# Patient Record
Sex: Female | Born: 1980 | Race: White | Hispanic: No | Marital: Married | Smoking: Never smoker
Health system: Southern US, Community
[De-identification: ages and names within clinical notes are randomized; demographics above are authoritative.]

## PROBLEM LIST (undated history)

## (undated) DIAGNOSIS — F909 Attention-deficit hyperactivity disorder, unspecified type: Secondary | ICD-10-CM

---

## 2016-04-28 ENCOUNTER — Ambulatory Visit
Admission: EM | Admit: 2016-04-28 | Discharge: 2016-04-28 | Disposition: A | Discharge status: Home / Self Care | Payer: BLUE CROSS/BLUE SHIELD | Attending: Family Medicine | Admitting: Family Medicine

## 2016-04-28 ENCOUNTER — Encounter: Payer: Self-pay | Admitting: Gynecology

## 2016-04-28 DIAGNOSIS — M545 Low back pain, unspecified: Secondary | ICD-10-CM

## 2016-04-28 DIAGNOSIS — S39012A Strain of muscle, fascia and tendon of lower back, initial encounter: Secondary | ICD-10-CM

## 2016-04-28 HISTORY — DX: Attention-deficit hyperactivity disorder, unspecified type: F90.9

## 2016-04-28 MED ORDER — HYDROCODONE-ACETAMINOPHEN 5-325 MG PO TABS: ORAL_TABLET | ORAL | Status: DC

## 2016-04-28 MED ORDER — CYCLOBENZAPRINE HCL 10 MG PO TABS: 10.0000 mg | ORAL_TABLET | Freq: Three times a day (TID) | ORAL | Status: DC | PRN

## 2016-04-28 NOTE — Discharge Instructions (Signed)

## 2016-04-28 NOTE — ED Notes (Signed)
Patient stated while bending over x today felt a crack in her back. Per patient felt afterwards.

## 2016-04-28 NOTE — ED Provider Notes (Signed)
CSN: [id number]     Arrival date & time 04/28/16  1223 History   First MD Initiated Contact with Patient 04/28/16 1248     Chief Complaint  Patient presents with  . Back Pain   (Consider location/radiation/quality/duration/timing/severity/associated sxs/prior Treatment) HPI Comments: 35 yo female with a c/o low back pain since this injuring it this morning. States she was bending forward to pick something up when she suddenly "felt a crack" and pain on her lower back. She denies falling or any direct trauma. Denies any fevers, chills, dysuria, numbness/tingling, saddle anesthesia, radiating pain, bowel or bladder problems.  The history is provided by the patient.    Past Medical History  Diagnosis Date  . ADHD (attention deficit hyperactivity disorder)    History reviewed. No pertinent past surgical history. No family history on file. Social History  Substance Use Topics  . Smoking status: Never Smoker   . Smokeless tobacco: None  . Alcohol Use: Yes   OB History    No data available     Review of Systems  Allergies  Codeine  Home Medications   Prior to Admission medications   Medication Sig Start Date End Date Taking? Authorizing Provider  amphetamine-dextroamphetamine (ADDERALL) 20 MG tablet Take 20 mg by mouth daily.   Yes Historical Provider, MD  cyclobenzaprine (FLEXERIL) 10 MG tablet Take 1 tablet (10 mg total) by mouth 3 (three) times daily as needed for muscle spasms. 04/28/16   Courtney Mccallumrlando Laylah Riga, MD  HYDROcodone-acetaminophen (NORCO/VICODIN) 5-325 MG tablet 1-2 tabs po q 8 hours prn 04/28/16   Courtney Mccallumrlando Polo Mcmartin, MD   Meds Ordered and Administered this Visit  Medications - No data to display  BP 121/73 mmHg  Pulse 69  Temp(Src) 98.1 F (36.7 C) (Oral)  Resp 16  Ht 5\' 6"  (1.676 m)  LMP 04/07/2016 No data found.   Physical Exam  Constitutional: She appears well-developed and well-nourished. No distress.  Musculoskeletal: She exhibits tenderness. She exhibits no  edema.       Lumbar back: She exhibits tenderness (over the lumbar sacral paraspinous muscles bilaterally; no bony tenderness) and spasm. She exhibits normal range of motion, no bony tenderness, no swelling, no edema, no deformity, no laceration, no pain and normal pulse.  Neurological: She is alert. She has normal reflexes. She displays normal reflexes. She exhibits normal muscle tone. Coordination normal.  Skin: Skin is warm and dry. No rash noted. She is not diaphoretic. No erythema.  Nursing note and vitals reviewed.   ED Course  Procedures (including critical care time)  Labs Review Labs Reviewed - No data to display  Imaging Review No results found.   Visual Acuity Review  Right Eye Distance:   Left Eye Distance:   Bilateral Distance:    Right Eye Near:   Left Eye Near:    Bilateral Near:         MDM   1. Midline low back pain without sciatica   2. Lumbar strain, initial encounter    Discharge Medication List as of 04/28/2016 12:59 PM    START taking these medications   Details  cyclobenzaprine (FLEXERIL) 10 MG tablet Take 1 tablet (10 mg total) by mouth 3 (three) times daily as needed for muscle spasms., Starting 04/28/2016, Until Discontinued, Normal    HYDROcodone-acetaminophen (NORCO/VICODIN) 5-325 MG tablet 1-2 tabs po q 8 hours prn, Print       1. diagnosis reviewed with patient 2. rx as per orders above; reviewed possible side effects, interactions, risks and  benefits  3. Recommend supportive treatment with heat, rest, gentle stretches  4. Follow-up prn if symptoms worsen or don't improve    Courtney Mccallum, MD 05/01/16 1530

## 2016-11-20 ENCOUNTER — Ambulatory Visit
Admission: EM | Admit: 2016-11-20 | Discharge: 2016-11-20 | Disposition: A | Discharge status: Home / Self Care | Payer: BLUE CROSS/BLUE SHIELD | Attending: Family Medicine | Admitting: Family Medicine

## 2016-11-20 ENCOUNTER — Ambulatory Visit
Admit: 2016-11-20 | Discharge: 2016-11-20 | Disposition: A | Discharge status: Home / Self Care | Payer: BLUE CROSS/BLUE SHIELD | Source: Ambulatory Visit | Attending: Emergency Medicine | Admitting: Emergency Medicine

## 2016-11-20 DIAGNOSIS — K828 Other specified diseases of gallbladder: Secondary | ICD-10-CM | POA: Insufficient documentation

## 2016-11-20 DIAGNOSIS — R1011 Right upper quadrant pain: Secondary | ICD-10-CM | POA: Insufficient documentation

## 2016-11-20 DIAGNOSIS — R112 Nausea with vomiting, unspecified: Secondary | ICD-10-CM | POA: Insufficient documentation

## 2016-11-20 DIAGNOSIS — R1013 Epigastric pain: Secondary | ICD-10-CM | POA: Diagnosis not present

## 2016-11-20 LAB — CBC WITH DIFFERENTIAL/PLATELET
Basophils Absolute: 0 10*3/uL (ref 0–0.1)
Basophils Relative: 1 %
Eosinophils Absolute: 0.1 10*3/uL (ref 0–0.7)
Eosinophils Relative: 2 %
HCT: 39.2 % (ref 35.0–47.0)
Hemoglobin: 13.1 g/dL (ref 12.0–16.0)
Lymphocytes Relative: 25 %
Lymphs Abs: 1.3 10*3/uL (ref 1.0–3.6)
MCH: 30.2 pg (ref 26.0–34.0)
MCHC: 33.3 g/dL (ref 32.0–36.0)
MCV: 90.5 fL (ref 80.0–100.0)
Monocytes Absolute: 0.4 10*3/uL (ref 0.2–0.9)
Monocytes Relative: 9 %
Neutro Abs: 3.3 10*3/uL (ref 1.4–6.5)
Neutrophils Relative %: 63 %
Platelets: 231 10*3/uL (ref 150–440)
RBC: 4.33 MIL/uL (ref 3.80–5.20)
RDW: 13.9 % (ref 11.5–14.5)
WBC: 5.1 10*3/uL (ref 3.6–11.0)

## 2016-11-20 LAB — COMPREHENSIVE METABOLIC PANEL
ALT: 13 U/L — ABNORMAL LOW (ref 14–54)
AST: 16 U/L (ref 15–41)
Albumin: 4.1 g/dL (ref 3.5–5.0)
Alkaline Phosphatase: 58 U/L (ref 38–126)
Anion gap: 5 (ref 5–15)
BUN: 13 mg/dL (ref 6–20)
CO2: 27 mmol/L (ref 22–32)
Calcium: 8.7 mg/dL — ABNORMAL LOW (ref 8.9–10.3)
Chloride: 103 mmol/L (ref 101–111)
Creatinine, Ser: 0.71 mg/dL (ref 0.44–1.00)
GFR calc Af Amer: >60 mL/min (ref >60)
GFR calc non Af Amer: >60 mL/min (ref >60)
Glucose, Bld: 96 mg/dL (ref 65–99)
Potassium: 4.1 mmol/L (ref 3.5–5.1)
Sodium: 135 mmol/L (ref 135–145)
Total Bilirubin: 0.7 mg/dL (ref 0.3–1.2)
Total Protein: 6.7 g/dL (ref 6.5–8.1)

## 2016-11-20 LAB — LIPASE, BLOOD: Lipase: 24 U/L (ref 11–51)

## 2016-11-20 MED ORDER — KETOROLAC TROMETHAMINE 60 MG/2ML IM SOLN
60.0000 mg | Freq: Once | INTRAMUSCULAR | Status: AC
  Administered 2016-11-20: 60 mg via INTRAMUSCULAR

## 2016-11-20 MED ORDER — ONDANSETRON 8 MG PO TBDP: 8.0000 mg | ORAL_TABLET | Freq: Two times a day (BID) | ORAL | 0 refills | Status: DC

## 2016-11-20 MED ORDER — NAPROXEN 500 MG PO TABS: 500.0000 mg | ORAL_TABLET | Freq: Two times a day (BID) | ORAL | 0 refills | Status: DC

## 2016-11-20 MED ORDER — PANTOPRAZOLE SODIUM 40 MG PO TBEC: 40.0000 mg | DELAYED_RELEASE_TABLET | Freq: Every day | ORAL | 0 refills | Status: DC

## 2016-11-20 NOTE — Progress Notes (Signed)
Results were discussed in detail with the patient over the phone. Advised her to follow-up with her primary care physician next week for scheduling of a scan. She acknowledged understanding. She will begin taking the medications that I have prescribed for her until she is evaluated by her primary care physician.

## 2016-11-20 NOTE — ED Triage Notes (Addendum)
Pt c/o acid reflux for the last 3 days. There is some relief but it seems to happen more at night. She did have a beer yesterday.

## 2016-11-20 NOTE — ED Notes (Signed)
Ultrasound Appt made for 4:30 today, pt was notified to stay NPO until 4:30pm

## 2016-11-20 NOTE — ED Provider Notes (Signed)
CSN: [id number]     Arrival date & time 11/20/16  1048 History   First MD Initiated Contact with Patient 11/20/16 1202     Chief Complaint  Patient presents with  . Gastroesophageal Reflux   (Consider location/radiation/quality/duration/timing/severity/associated sxs/prior Treatment) HPI  This a 36 year old female who presents today history of epigastric and right upper quadrant pain with nausea and vomiting. She states that the pain has been worse last night and today. In the past her primary care physician has wanted her to have a HIDA scan but for various reasons the patient was not able to undergo the HIDA scan. At the present time she indicates pain in the costophrenic angle  anterior on the right with  radiation into her back.. At the present time she is not nauseated. Pain is 6 out of 10.        Past Medical History:  Diagnosis Date  . ADHD (attention deficit hyperactivity disorder)    History reviewed. No pertinent surgical history. Family History  Problem Relation Age of Onset  . Cholecystitis Mother   . Hypertension Father    Social History  Substance Use Topics  . Smoking status: Never Smoker  . Smokeless tobacco: Never Used  . Alcohol use Yes   OB History    No data available     Review of Systems  Constitutional: Positive for activity change and appetite change. Negative for chills, fatigue and fever.  Gastrointestinal: Positive for abdominal pain, nausea and vomiting. Negative for constipation, diarrhea and rectal pain.  All other systems reviewed and are negative.   Allergies  Codeine  Home Medications   Prior to Admission medications   Medication Sig Start Date End Date Taking? Authorizing Provider  amphetamine-dextroamphetamine (ADDERALL) 20 MG tablet Take 20 mg by mouth daily.   Yes Historical Provider, MD  ranitidine (ZANTAC) 150 MG tablet Take 150 mg by mouth 2 (two) times daily.   Yes Historical Provider, MD  naproxen (NAPROSYN) 500 MG tablet  Take 1 tablet (500 mg total) by mouth 2 (two) times daily with a meal. 11/20/16   Lutricia FeilWilliam P Roemer, PA-C  ondansetron (ZOFRAN ODT) 8 MG disintegrating tablet Take 1 tablet (8 mg total) by mouth 2 (two) times daily. 11/20/16   Lutricia FeilWilliam P Roemer, PA-C  pantoprazole (PROTONIX) 40 MG tablet Take 1 tablet (40 mg total) by mouth daily. 11/20/16   Lutricia FeilWilliam P Roemer, PA-C   Meds Ordered and Administered this Visit   Medications  ketorolac (TORADOL) injection 60 mg (60 mg Intramuscular Given 11/20/16 1234)    BP 120/84 (BP Location: Left Arm)   Pulse 72   Temp 98 F (36.7 C) (Oral)   Resp 18   Ht 5\' 6"  (1.676 m)   Wt 162 lb (73.5 kg)   LMP 11/12/2016   SpO2 99%   BMI 26.15 kg/m  No data found.   Physical Exam  Constitutional: She is oriented to person, place, and time. She appears well-developed and well-nourished. No distress.  HENT:  Head: Normocephalic and atraumatic.  Eyes: EOM are normal. Pupils are equal, round, and reactive to light.  Neck: Normal range of motion. Neck supple.  Pulmonary/Chest: Effort normal and breath sounds normal. No respiratory distress. She has no wheezes. She has no rales.  Abdominal: Soft. Bowel sounds are normal. She exhibits no distension and no mass. There is tenderness. There is no rebound and no guarding.  Abdominal examination was performed with Jacki ConesLaurie, RN as Nurse, children'schaperone and assistant. Patient shows no distention of  the abdominal wall. Sounds are normal in all quadrants. She has mild tenderness in the epigastric region but more significant tenderness over the costophrenic angle at the right upper quadrant. There is a positive Murphy's sign per present. There is no guarding and no rebound present. Some diffuse tenderness in the upper quadrants.  Musculoskeletal: Normal range of motion.  Lymphadenopathy:    She has no cervical adenopathy.  Neurological: She is alert and oriented to person, place, and time.  Skin: Skin is warm and dry. She is not diaphoretic.   Psychiatric: She has a normal mood and affect. Her behavior is normal. Judgment and thought content normal.  Nursing note and vitals reviewed.   Urgent Care Course   Clinical Course     Procedures (including critical care time)  Labs Review Labs Reviewed  COMPREHENSIVE METABOLIC PANEL - Abnormal; Notable for the following:       Result Value   Calcium 8.7 (*)    ALT 13 (*)    All other components within normal limits  CBC WITH DIFFERENTIAL/PLATELET  LIPASE, BLOOD    Imaging Review No results found.   Visual Acuity Review  Right Eye Distance:   Left Eye Distance:   Bilateral Distance:    Right Eye Near:   Left Eye Near:    Bilateral Near:     Medications  ketorolac (TORADOL) injection 60 mg (60 mg Intramuscular Given 11/20/16 1234)      MDM   1. Epigastric abdominal pain   2. Non-intractable vomiting with nausea, unspecified vomiting type    Discharge Medication List as of 11/20/2016  1:15 PM    START taking these medications   Details  naproxen (NAPROSYN) 500 MG tablet Take 1 tablet (500 mg total) by mouth 2 (two) times daily with a meal., Starting Tue 11/20/2016, Normal    ondansetron (ZOFRAN ODT) 8 MG disintegrating tablet Take 1 tablet (8 mg total) by mouth 2 (two) times daily., Starting Tue 11/20/2016, Normal    pantoprazole (PROTONIX) 40 MG tablet Take 1 tablet (40 mg total) by mouth daily., Starting Tue 11/20/2016, Normal      Plan: 1. Test/x-ray results and diagnosis reviewed with patient 2. rx as per orders; risks, benefits, potential side effects reviewed with patient 3. Recommend supportive treatment with Fluids and rest. Patient was set up for a abdominal ultrasound of the right upper quadrant to rule out cholecystitis.to be performed at [facility] at 4:30 this afternoon. She did receive only slight improvement in her pain after the Toradol injection. She was not nauseated and did not vomit while in the facility. I told her that she will need to follow-up  with her primary care provider evaluation and treatment. If she has worsening of her pain she should be seen in the emergency room. 4. F/u prn if symptoms worsen or don't improve     Lutricia Feil, PA-C 11/20/16 1328

## 2018-01-06 IMAGING — US US ABDOMEN LIMITED
1 series · 14 of 25 positions shown · non-contrast
Comparison: None in PACs

CLINICAL DATA: Four days of right upper quadrant pain associated
with nausea and vomiting.

EXAM:
US ABDOMEN LIMITED - RIGHT UPPER QUADRANT

[Series 1: us abdomen limited · 0.14mm/px · 14 of 59 slices shown]
[im 1/59]
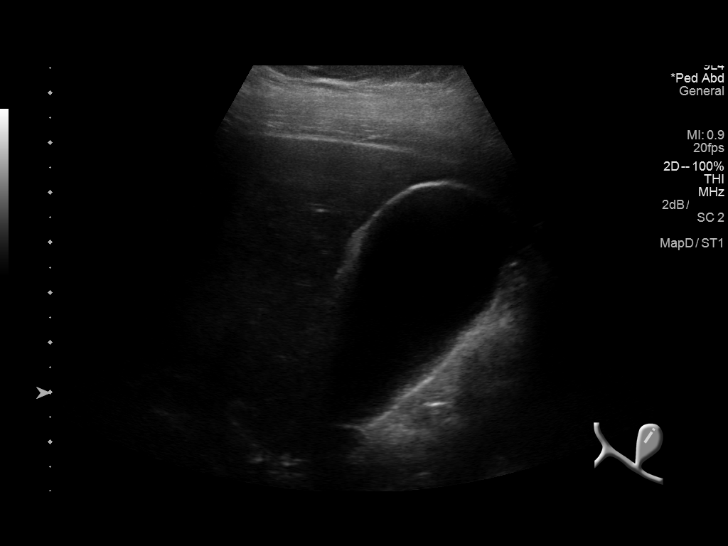
[im 5/59]
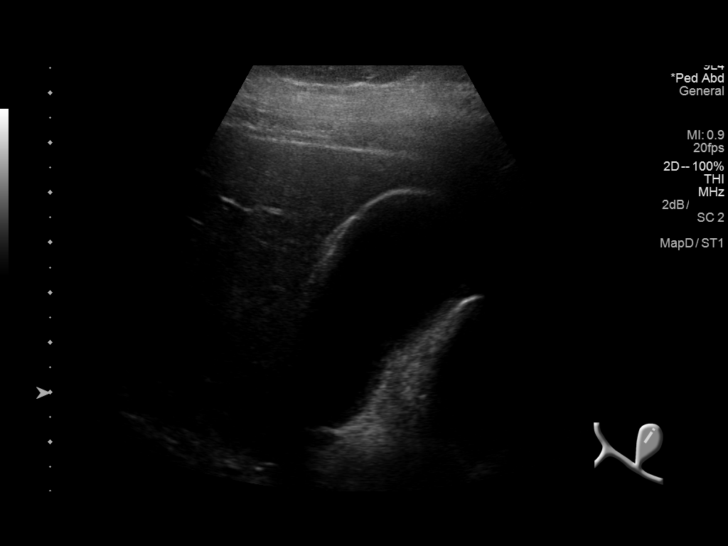
[im 10/59]
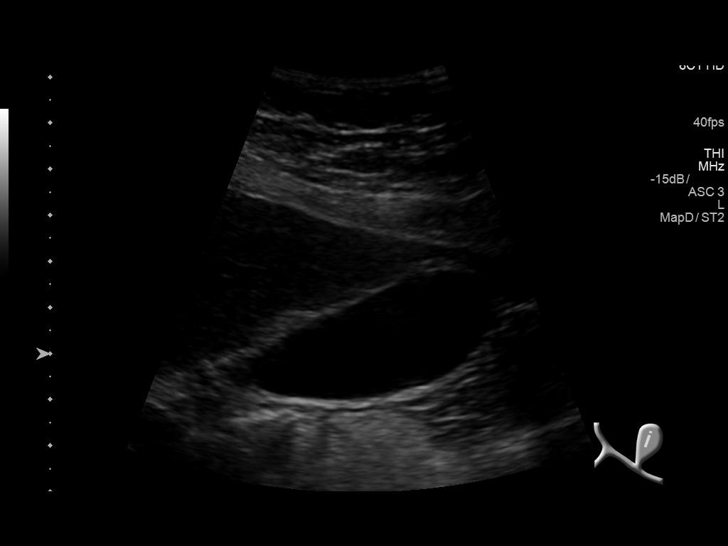
[im 15/59]
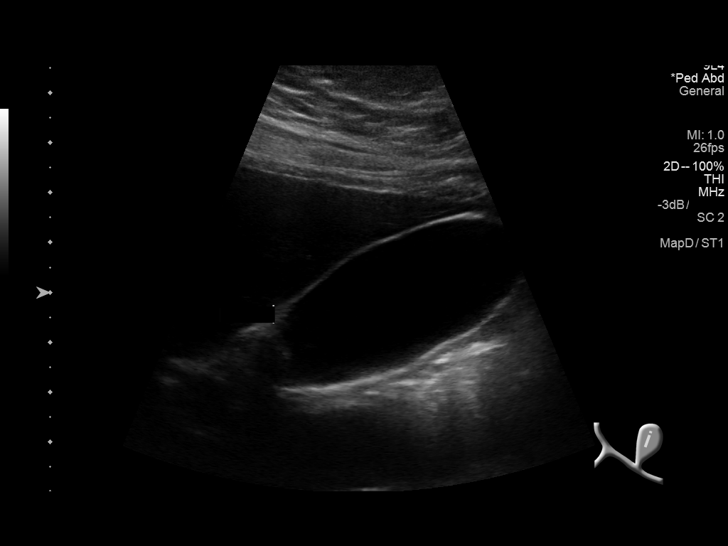
[im 20/59]
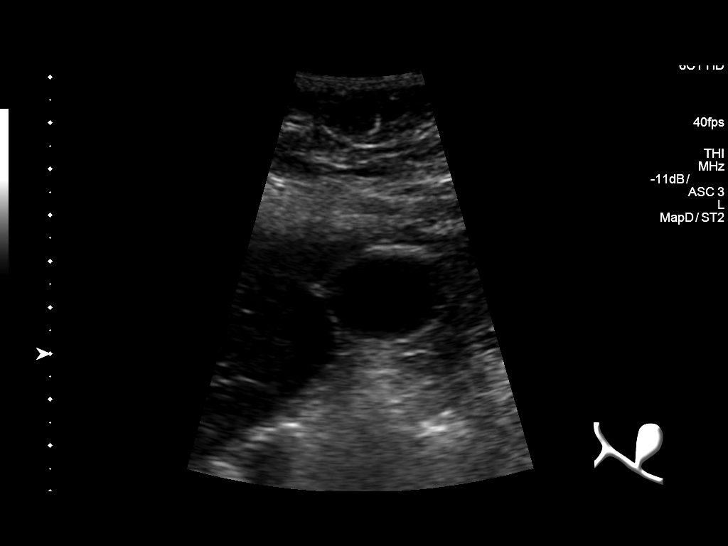
[im 22/59]
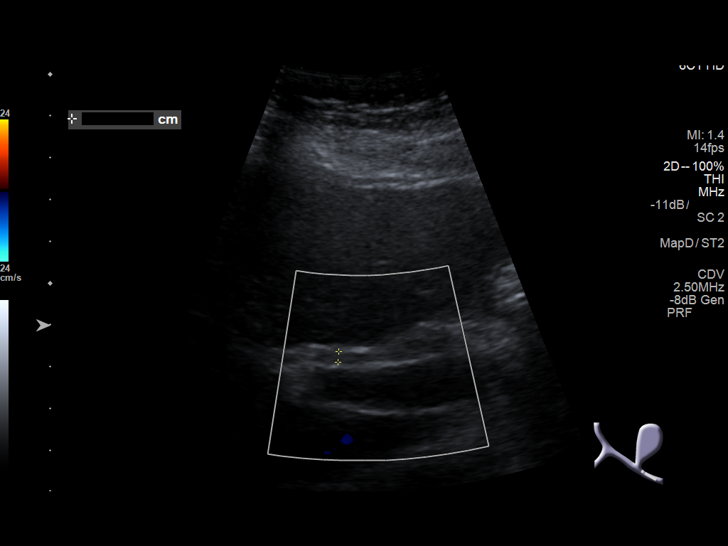
[im 27/59]
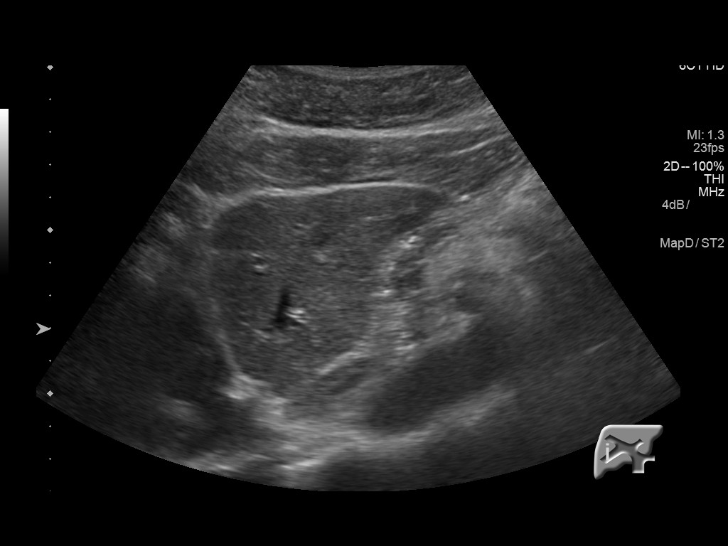
[im 32/59]
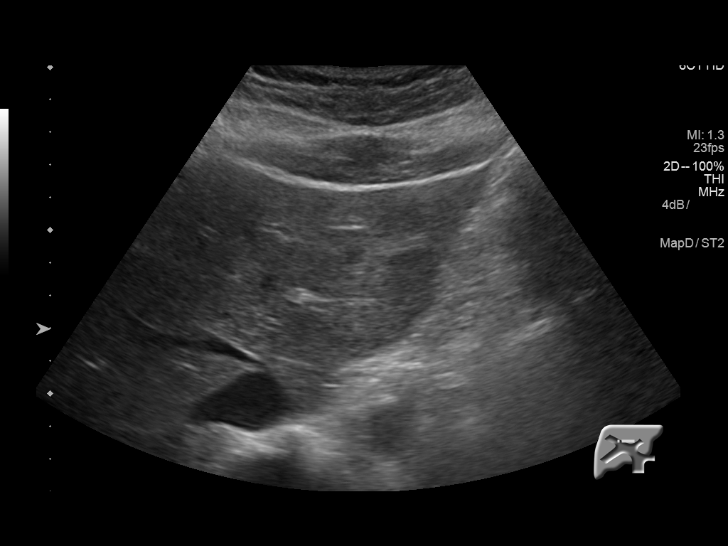
[im 37/59]
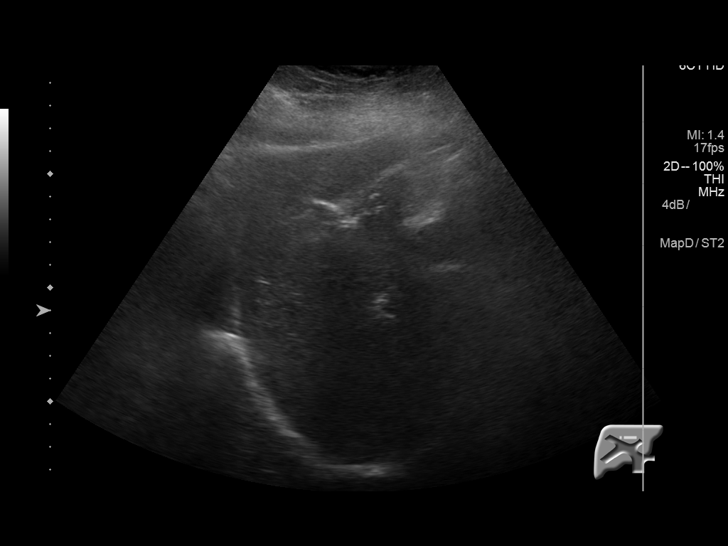
[im 39/59]
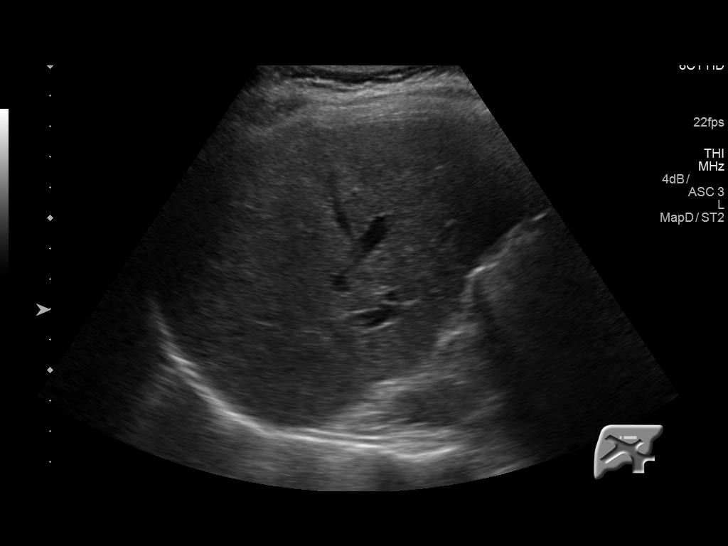
[im 44/59]
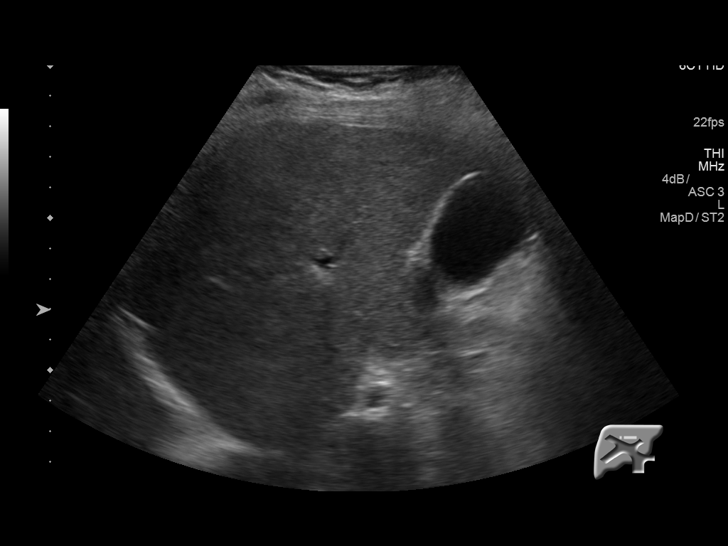
[im 49/59]
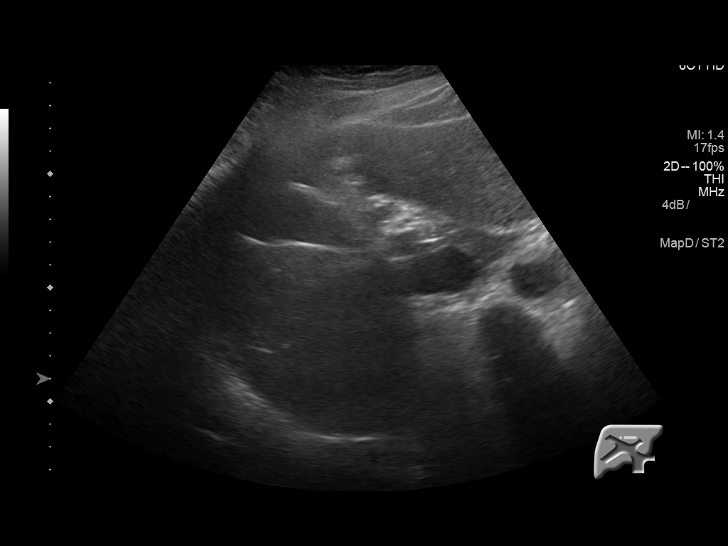
[im 54/59]
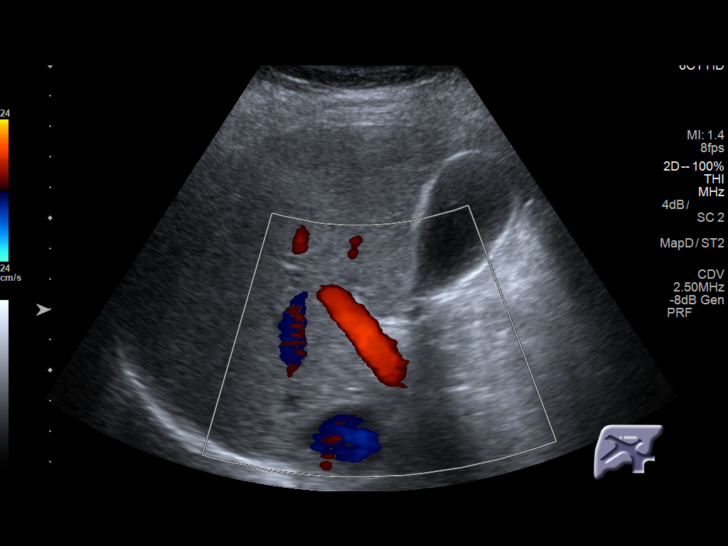
[im 59/59]
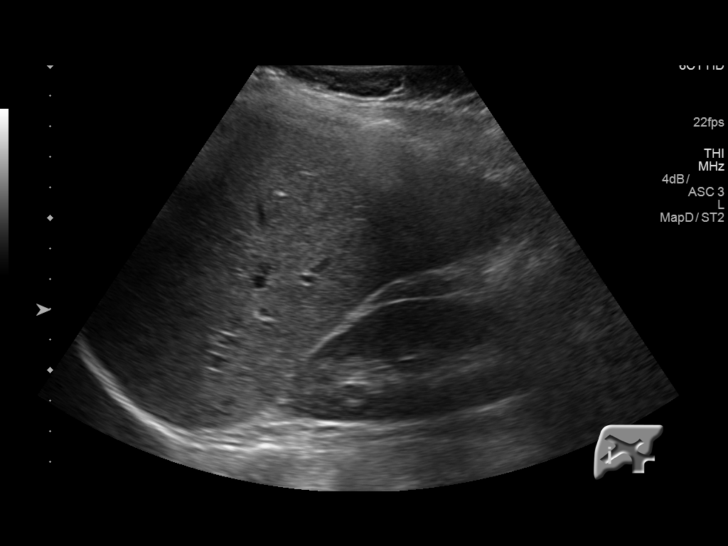

[14 of 25 positions shown; findings below may reference images not displayed]

FINDINGS: Gallbladder:

The gallbladder is mildly distended. No stones or sludge are
observed. There is no wall thickening or pericholecystic fluid.
There is no positive sonographic Murphy's sign.

Common bile duct:

Diameter: 2.2 mm

Liver:

The hepatic echotexture is normal. There is no focal mass or ductal
dilation.
IMPRESSION: Mildly distended gallbladder without evidence of acute cholecystitis
nor stones. Normal appearance of the common bile duct and liver. If
there are clinical concerns of chronic gallbladder dysfunction, a
nuclear medicine hepatobiliary scan with gallbladder ejection
fraction determination may be useful.

## 2018-03-24 DIAGNOSIS — O2342 Unspecified infection of urinary tract in pregnancy, second trimester: Secondary | ICD-10-CM | POA: Diagnosis not present

## 2018-03-27 DIAGNOSIS — M955 Acquired deformity of pelvis: Secondary | ICD-10-CM | POA: Diagnosis not present

## 2018-03-27 DIAGNOSIS — M9902 Segmental and somatic dysfunction of thoracic region: Secondary | ICD-10-CM | POA: Diagnosis not present

## 2018-03-27 DIAGNOSIS — M9901 Segmental and somatic dysfunction of cervical region: Secondary | ICD-10-CM | POA: Diagnosis not present

## 2018-03-27 DIAGNOSIS — M545 Low back pain: Secondary | ICD-10-CM | POA: Diagnosis not present

## 2018-03-27 DIAGNOSIS — M9903 Segmental and somatic dysfunction of lumbar region: Secondary | ICD-10-CM | POA: Diagnosis not present

## 2018-03-27 DIAGNOSIS — M546 Pain in thoracic spine: Secondary | ICD-10-CM | POA: Diagnosis not present

## 2018-03-27 DIAGNOSIS — M531 Cervicobrachial syndrome: Secondary | ICD-10-CM | POA: Diagnosis not present

## 2018-03-27 DIAGNOSIS — Z331 Pregnant state, incidental: Secondary | ICD-10-CM | POA: Diagnosis not present

## 2018-03-27 DIAGNOSIS — M461 Sacroiliitis, not elsewhere classified: Secondary | ICD-10-CM | POA: Diagnosis not present

## 2018-03-27 DIAGNOSIS — M9905 Segmental and somatic dysfunction of pelvic region: Secondary | ICD-10-CM | POA: Diagnosis not present

## 2018-04-21 DIAGNOSIS — Z3A28 28 weeks gestation of pregnancy: Secondary | ICD-10-CM | POA: Diagnosis not present

## 2018-04-21 DIAGNOSIS — Z3689 Encounter for other specified antenatal screening: Secondary | ICD-10-CM | POA: Diagnosis not present

## 2018-04-21 DIAGNOSIS — Z23 Encounter for immunization: Secondary | ICD-10-CM | POA: Diagnosis not present

## 2018-04-21 DIAGNOSIS — Z369 Encounter for antenatal screening, unspecified: Secondary | ICD-10-CM | POA: Diagnosis not present

## 2018-04-21 DIAGNOSIS — O09523 Supervision of elderly multigravida, third trimester: Secondary | ICD-10-CM | POA: Diagnosis not present

## 2018-04-21 DIAGNOSIS — R52 Pain, unspecified: Secondary | ICD-10-CM | POA: Diagnosis not present

## 2018-04-29 DIAGNOSIS — Z3482 Encounter for supervision of other normal pregnancy, second trimester: Secondary | ICD-10-CM | POA: Diagnosis not present

## 2018-04-29 DIAGNOSIS — Z3483 Encounter for supervision of other normal pregnancy, third trimester: Secondary | ICD-10-CM | POA: Diagnosis not present

## 2018-05-08 DIAGNOSIS — Z3A30 30 weeks gestation of pregnancy: Secondary | ICD-10-CM | POA: Diagnosis not present

## 2018-05-08 DIAGNOSIS — N760 Acute vaginitis: Secondary | ICD-10-CM | POA: Diagnosis not present

## 2018-05-08 DIAGNOSIS — O09523 Supervision of elderly multigravida, third trimester: Secondary | ICD-10-CM | POA: Diagnosis not present

## 2018-06-03 DIAGNOSIS — N760 Acute vaginitis: Secondary | ICD-10-CM | POA: Diagnosis not present

## 2018-06-23 DIAGNOSIS — Z331 Pregnant state, incidental: Secondary | ICD-10-CM | POA: Diagnosis not present

## 2018-06-23 DIAGNOSIS — R5383 Other fatigue: Secondary | ICD-10-CM | POA: Diagnosis not present

## 2018-06-23 DIAGNOSIS — Z3A37 37 weeks gestation of pregnancy: Secondary | ICD-10-CM | POA: Diagnosis not present

## 2018-07-11 DIAGNOSIS — Z885 Allergy status to narcotic agent status: Secondary | ICD-10-CM | POA: Diagnosis not present

## 2018-07-11 DIAGNOSIS — Z3A40 40 weeks gestation of pregnancy: Secondary | ICD-10-CM | POA: Diagnosis not present

## 2018-07-11 DIAGNOSIS — O323XX0 Maternal care for face, brow and chin presentation, not applicable or unspecified: Secondary | ICD-10-CM | POA: Diagnosis not present

## 2018-07-11 DIAGNOSIS — O480 Post-term pregnancy: Secondary | ICD-10-CM | POA: Diagnosis not present

## 2018-07-11 DIAGNOSIS — O99214 Obesity complicating childbirth: Secondary | ICD-10-CM | POA: Diagnosis not present

## 2018-07-11 DIAGNOSIS — O403XX0 Polyhydramnios, third trimester, not applicable or unspecified: Secondary | ICD-10-CM | POA: Diagnosis not present

## 2018-07-11 DIAGNOSIS — K649 Unspecified hemorrhoids: Secondary | ICD-10-CM | POA: Diagnosis not present

## 2018-07-11 DIAGNOSIS — E669 Obesity, unspecified: Secondary | ICD-10-CM | POA: Diagnosis not present

## 2018-07-11 DIAGNOSIS — O99824 Streptococcus B carrier state complicating childbirth: Secondary | ICD-10-CM | POA: Diagnosis not present

## 2018-07-12 DIAGNOSIS — O99214 Obesity complicating childbirth: Secondary | ICD-10-CM | POA: Diagnosis not present

## 2018-07-12 DIAGNOSIS — Z3A40 40 weeks gestation of pregnancy: Secondary | ICD-10-CM | POA: Diagnosis not present

## 2018-07-12 DIAGNOSIS — O323XX0 Maternal care for face, brow and chin presentation, not applicable or unspecified: Secondary | ICD-10-CM | POA: Diagnosis not present

## 2018-07-12 DIAGNOSIS — K649 Unspecified hemorrhoids: Secondary | ICD-10-CM | POA: Diagnosis not present

## 2018-07-12 DIAGNOSIS — R0789 Other chest pain: Secondary | ICD-10-CM | POA: Diagnosis not present

## 2018-07-12 DIAGNOSIS — O480 Post-term pregnancy: Secondary | ICD-10-CM | POA: Diagnosis not present

## 2018-07-12 DIAGNOSIS — E669 Obesity, unspecified: Secondary | ICD-10-CM | POA: Diagnosis not present

## 2018-07-12 DIAGNOSIS — Z885 Allergy status to narcotic agent status: Secondary | ICD-10-CM | POA: Diagnosis not present

## 2018-07-28 DIAGNOSIS — Z789 Other specified health status: Secondary | ICD-10-CM | POA: Diagnosis not present

## 2018-07-28 DIAGNOSIS — O8620 Urinary tract infection following delivery, unspecified: Secondary | ICD-10-CM | POA: Diagnosis not present

## 2018-07-28 DIAGNOSIS — R399 Unspecified symptoms and signs involving the genitourinary system: Secondary | ICD-10-CM | POA: Diagnosis not present

## 2018-07-28 DIAGNOSIS — Z6832 Body mass index (BMI) 32.0-32.9, adult: Secondary | ICD-10-CM | POA: Diagnosis not present

## 2018-08-25 DIAGNOSIS — R69 Illness, unspecified: Secondary | ICD-10-CM | POA: Diagnosis not present

## 2018-08-26 DIAGNOSIS — Z23 Encounter for immunization: Secondary | ICD-10-CM | POA: Diagnosis not present

## 2019-01-22 DIAGNOSIS — Z6833 Body mass index (BMI) 33.0-33.9, adult: Secondary | ICD-10-CM | POA: Diagnosis not present

## 2019-01-22 DIAGNOSIS — J101 Influenza due to other identified influenza virus with other respiratory manifestations: Secondary | ICD-10-CM | POA: Diagnosis not present

## 2019-06-15 DIAGNOSIS — R69 Illness, unspecified: Secondary | ICD-10-CM | POA: Diagnosis not present

## 2019-06-15 DIAGNOSIS — L409 Psoriasis, unspecified: Secondary | ICD-10-CM | POA: Diagnosis not present

## 2019-08-24 ENCOUNTER — Other Ambulatory Visit: Payer: Self-pay

## 2019-08-24 DIAGNOSIS — Z20822 Contact with and (suspected) exposure to covid-19: Secondary | ICD-10-CM

## 2019-08-25 LAB — NOVEL CORONAVIRUS, NAA: SARS-CoV-2, NAA: NOT DETECTED

## 2019-09-28 DIAGNOSIS — R69 Illness, unspecified: Secondary | ICD-10-CM | POA: Diagnosis not present

## 2019-09-28 DIAGNOSIS — L409 Psoriasis, unspecified: Secondary | ICD-10-CM | POA: Diagnosis not present

## 2019-09-28 DIAGNOSIS — L309 Dermatitis, unspecified: Secondary | ICD-10-CM | POA: Diagnosis not present

## 2019-10-13 DIAGNOSIS — Z23 Encounter for immunization: Secondary | ICD-10-CM | POA: Diagnosis not present

## 2019-12-14 ENCOUNTER — Ambulatory Visit: Payer: No Typology Code available for payment source | Attending: Internal Medicine

## 2019-12-14 DIAGNOSIS — Z20822 Contact with and (suspected) exposure to covid-19: Secondary | ICD-10-CM | POA: Diagnosis not present

## 2019-12-15 ENCOUNTER — Other Ambulatory Visit: Payer: Self-pay

## 2019-12-15 LAB — NOVEL CORONAVIRUS, NAA: SARS-CoV-2, NAA: NOT DETECTED

## 2019-12-28 DIAGNOSIS — F419 Anxiety disorder, unspecified: Secondary | ICD-10-CM | POA: Diagnosis not present

## 2019-12-28 DIAGNOSIS — F439 Reaction to severe stress, unspecified: Secondary | ICD-10-CM | POA: Diagnosis not present

## 2019-12-28 DIAGNOSIS — R69 Illness, unspecified: Secondary | ICD-10-CM | POA: Diagnosis not present

## 2020-03-28 DIAGNOSIS — F419 Anxiety disorder, unspecified: Secondary | ICD-10-CM | POA: Diagnosis not present

## 2020-03-28 DIAGNOSIS — R69 Illness, unspecified: Secondary | ICD-10-CM | POA: Diagnosis not present

## 2020-03-28 DIAGNOSIS — F439 Reaction to severe stress, unspecified: Secondary | ICD-10-CM | POA: Diagnosis not present

## 2020-04-14 DIAGNOSIS — R69 Illness, unspecified: Secondary | ICD-10-CM | POA: Diagnosis not present

## 2020-05-02 DIAGNOSIS — R69 Illness, unspecified: Secondary | ICD-10-CM | POA: Diagnosis not present

## 2020-05-30 DIAGNOSIS — R69 Illness, unspecified: Secondary | ICD-10-CM | POA: Diagnosis not present

## 2020-07-20 DIAGNOSIS — F988 Other specified behavioral and emotional disorders with onset usually occurring in childhood and adolescence: Secondary | ICD-10-CM | POA: Diagnosis not present

## 2020-07-20 DIAGNOSIS — R69 Illness, unspecified: Secondary | ICD-10-CM | POA: Diagnosis not present

## 2020-07-20 DIAGNOSIS — F439 Reaction to severe stress, unspecified: Secondary | ICD-10-CM | POA: Diagnosis not present

## 2020-12-21 DIAGNOSIS — I471 Supraventricular tachycardia: Secondary | ICD-10-CM | POA: Diagnosis not present

## 2020-12-21 DIAGNOSIS — Z13220 Encounter for screening for lipoid disorders: Secondary | ICD-10-CM | POA: Diagnosis not present

## 2020-12-21 DIAGNOSIS — Z6828 Body mass index (BMI) 28.0-28.9, adult: Secondary | ICD-10-CM | POA: Diagnosis not present

## 2020-12-21 DIAGNOSIS — R69 Illness, unspecified: Secondary | ICD-10-CM | POA: Diagnosis not present

## 2020-12-21 DIAGNOSIS — R5383 Other fatigue: Secondary | ICD-10-CM | POA: Diagnosis not present

## 2020-12-21 DIAGNOSIS — F4322 Adjustment disorder with anxiety: Secondary | ICD-10-CM | POA: Diagnosis not present

## 2020-12-21 DIAGNOSIS — Z23 Encounter for immunization: Secondary | ICD-10-CM | POA: Diagnosis not present

## 2020-12-21 DIAGNOSIS — Z131 Encounter for screening for diabetes mellitus: Secondary | ICD-10-CM | POA: Diagnosis not present

## 2021-03-28 DIAGNOSIS — Z6829 Body mass index (BMI) 29.0-29.9, adult: Secondary | ICD-10-CM | POA: Diagnosis not present

## 2021-03-28 DIAGNOSIS — R69 Illness, unspecified: Secondary | ICD-10-CM | POA: Diagnosis not present

## 2021-03-28 DIAGNOSIS — F4322 Adjustment disorder with anxiety: Secondary | ICD-10-CM | POA: Diagnosis not present

## 2021-07-11 DIAGNOSIS — G8929 Other chronic pain: Secondary | ICD-10-CM | POA: Diagnosis not present

## 2021-07-11 DIAGNOSIS — Z6829 Body mass index (BMI) 29.0-29.9, adult: Secondary | ICD-10-CM | POA: Diagnosis not present

## 2021-07-11 DIAGNOSIS — R69 Illness, unspecified: Secondary | ICD-10-CM | POA: Diagnosis not present

## 2021-07-11 DIAGNOSIS — M545 Low back pain, unspecified: Secondary | ICD-10-CM | POA: Diagnosis not present

## 2021-07-11 DIAGNOSIS — L409 Psoriasis, unspecified: Secondary | ICD-10-CM | POA: Diagnosis not present

## 2021-09-20 DIAGNOSIS — Z23 Encounter for immunization: Secondary | ICD-10-CM | POA: Diagnosis not present

## 2021-10-02 ENCOUNTER — Emergency Department: Payer: 59

## 2021-10-02 ENCOUNTER — Emergency Department
Admission: EM | Admit: 2021-10-02 | Discharge: 2021-10-02 | Disposition: A | Discharge status: Home / Self Care | Payer: 59 | Attending: Emergency Medicine | Admitting: Emergency Medicine

## 2021-10-02 ENCOUNTER — Other Ambulatory Visit: Payer: Self-pay

## 2021-10-02 DIAGNOSIS — M79601 Pain in right arm: Secondary | ICD-10-CM | POA: Insufficient documentation

## 2021-10-02 DIAGNOSIS — M5412 Radiculopathy, cervical region: Secondary | ICD-10-CM | POA: Diagnosis not present

## 2021-10-02 DIAGNOSIS — R0789 Other chest pain: Secondary | ICD-10-CM | POA: Insufficient documentation

## 2021-10-02 DIAGNOSIS — R0602 Shortness of breath: Secondary | ICD-10-CM | POA: Diagnosis not present

## 2021-10-02 LAB — COMPREHENSIVE METABOLIC PANEL
ALT: 14 U/L (ref 0–44)
AST: 17 U/L (ref 15–41)
Albumin: 3.6 g/dL (ref 3.5–5.0)
Alkaline Phosphatase: 62 U/L (ref 38–126)
Anion gap: 7 (ref 5–15)
BUN: 11 mg/dL (ref 6–20)
CO2: 25 mmol/L (ref 22–32)
Calcium: 8.8 mg/dL — ABNORMAL LOW (ref 8.9–10.3)
Chloride: 106 mmol/L (ref 98–111)
Creatinine, Ser: 0.8 mg/dL (ref 0.44–1.00)
GFR, Estimated: >60 mL/min (ref >60)
Glucose, Bld: 104 mg/dL — ABNORMAL HIGH (ref 70–99)
Potassium: 3.8 mmol/L (ref 3.5–5.1)
Sodium: 138 mmol/L (ref 135–145)
Total Bilirubin: 0.5 mg/dL (ref 0.3–1.2)
Total Protein: 7 g/dL (ref 6.5–8.1)

## 2021-10-02 LAB — CBC WITH DIFFERENTIAL/PLATELET
Abs Immature Granulocytes: 0.02 10*3/uL (ref 0.00–0.07)
Basophils Absolute: 0 10*3/uL (ref 0.0–0.1)
Basophils Relative: 0 %
Eosinophils Absolute: 0.3 10*3/uL (ref 0.0–0.5)
Eosinophils Relative: 4 %
HCT: 37.7 % (ref 36.0–46.0)
Hemoglobin: 12.3 g/dL (ref 12.0–15.0)
Immature Granulocytes: 0 %
Lymphocytes Relative: 16 %
Lymphs Abs: 1.4 10*3/uL (ref 0.7–4.0)
MCH: 28.3 pg (ref 26.0–34.0)
MCHC: 32.6 g/dL (ref 30.0–36.0)
MCV: 86.7 fL (ref 80.0–100.0)
Monocytes Absolute: 0.7 10*3/uL (ref 0.1–1.0)
Monocytes Relative: 8 %
Neutro Abs: 6.4 10*3/uL (ref 1.7–7.7)
Neutrophils Relative %: 72 %
Platelets: 286 10*3/uL (ref 150–400)
RBC: 4.35 MIL/uL (ref 3.87–5.11)
RDW: 14.3 % (ref 11.5–15.5)
WBC: 8.8 10*3/uL (ref 4.0–10.5)
nRBC: 0 % (ref 0.0–0.2)

## 2021-10-02 MED ORDER — NAPROXEN 500 MG PO TABS: 500.0000 mg | ORAL_TABLET | Freq: Two times a day (BID) | ORAL | 0 refills | Status: AC

## 2021-10-02 MED ORDER — CYCLOBENZAPRINE HCL 10 MG PO TABS: 10.0000 mg | ORAL_TABLET | Freq: Three times a day (TID) | ORAL | 0 refills | Status: DC | PRN

## 2021-10-02 NOTE — ED Provider Notes (Signed)
Emergency Medicine Provider Triage Evaluation Note  Marzelle Rutten , a 40 y.o. female  was evaluated in triage.  Pt complains of right-sided chest pain.  Right-sided arm numbness and pain.  Ate [place] food Friday night had burning in her chest all night Saturday.  Got better.  Woke today with pain in the right side of her chest and right arm pain.  History of cholecystectomy in 2018.  Review of Systems  Positive: Chest pain, right arm pain Negative: Fever, chills, cough or congestion  Physical Exam  Ht 5\' 5"  (1.651 m)   Wt 79.4 kg   BMI 29.12 kg/m  Gen:   Awake, no distress   Resp:  Normal effort  MSK:   Moves extremities without difficulty  Other:    Medical Decision Making  Medically screening exam initiated at 2:04 PM.  Appropriate orders placed.  Toriann Spadoni was informed that the remainder of the evaluation will be completed by another provider, this initial triage assessment does not replace that evaluation, and the importance of remaining in the ED until their evaluation is complete.     Skip Estimable, PA-C 10/02/21 1404    10/04/21, MD 10/02/21 1538

## 2021-10-02 NOTE — ED Provider Notes (Signed)
[facility] Emergency Department Provider Note  ____________________________________________   Event Date/Time   First MD Initiated Contact with Patient 10/02/21 1614     (approximate)  I have reviewed the triage vital signs and the nursing notes.   HISTORY  Chief Complaint Shortness of Breath    HPI Courtney Stein is a 40 y.o. female with past medical history of ADHD here with right-sided arm pain.  The patient states that for the last several days, she has had aching, throbbing, right elbow and arm pain as well as right upper chest pain.  She states the pain began after she woke up from sleep.  She does not believe that there was any specific injury.  The patient has had fairly constant pain since then.  She describes it as an aching, throbbing type sensation along her right upper chest wall and right medial upper arm extending to her elbow.  The pain is worse with certain movements and position changes.  She has had some associated tingling and numbness sensation shooting down her arm to her distal fingers.  Denies any specific triggers.  She does hold her 63-year-old son with that arm and sleeps with that arm above her head as well.  Denies any direct injuries.  No numbness or weakness that is persistent.  Denies any cough.  She states that she has noticed some mild indigestion earlier, but this is resolved.  She is status post cholecystectomy.  Denies history of cardiac disease.  No history of PE.    Past Medical History:  Diagnosis Date  . ADHD (attention deficit hyperactivity disorder)     There are no problems to display for this patient.   No past surgical history on file.  Prior to Admission medications   Medication Sig Start Date End Date Taking? Authorizing Provider  cyclobenzaprine (FLEXERIL) 10 MG tablet Take 1 tablet (10 mg total) by mouth 3 (three) times daily as needed for muscle spasms. 10/02/21  Yes Shaune Pollack, MD  naproxen  (NAPROSYN) 500 MG tablet Take 1 tablet (500 mg total) by mouth 2 (two) times daily with a meal for 7 days. 10/02/21 10/09/21 Yes Shaune Pollack, MD  amphetamine-dextroamphetamine (ADDERALL) 20 MG tablet Take 20 mg by mouth daily.    [provider]  ondansetron (ZOFRAN ODT) 8 MG disintegrating tablet Take 1 tablet (8 mg total) by mouth 2 (two) times daily. 11/20/16   Lutricia Feil, PA-C  pantoprazole (PROTONIX) 40 MG tablet Take 1 tablet (40 mg total) by mouth daily. 11/20/16   Lutricia Feil, PA-C  ranitidine (ZANTAC) 150 MG tablet Take 150 mg by mouth 2 (two) times daily.    [provider]    Allergies Codeine and Reglan [metoclopramide]  Family History  Problem Relation Age of Onset  . Cholecystitis Mother   . Hypertension Father     Social History Social History   Tobacco Use  . Smoking status: Never  . Smokeless tobacco: Never  Substance Use Topics  . Alcohol use: Yes  . Drug use: No    Review of Systems  Review of Systems  Constitutional:  Negative for fatigue and fever.  HENT:  Negative for congestion and sore throat.   Eyes:  Negative for visual disturbance.  Respiratory:  Negative for cough and shortness of breath.   Cardiovascular:  Negative for chest pain.  Gastrointestinal:  Negative for abdominal pain, diarrhea, nausea and vomiting.  Genitourinary:  Negative for flank pain.  Musculoskeletal:  Positive for  arthralgias and neck pain. Negative for back pain.  Skin:  Negative for rash and wound.  Neurological:  Negative for weakness.    ____________________________________________  PHYSICAL EXAM:      VITAL SIGNS: ED Triage Vitals  Enc Vitals Group     BP 10/02/21 1404 132/86     Pulse Rate 10/02/21 1404 82     Resp 10/02/21 1404 18     Temp 10/02/21 1404 97.8 F (36.6 C)     Temp Source 10/02/21 1404 Oral     SpO2 10/02/21 1404 100 %     Weight 10/02/21 1402 175 lb (79.4 kg)     Height 10/02/21 1402 5\' 5"  (1.651 m)     Head  Circumference --      Peak Flow --      Pain Score 10/02/21 1402 7     Pain Loc --      Pain Edu? --      Excl. in [place]? --      Physical Exam Vitals and nursing note reviewed.  Constitutional:      General: She is not in acute distress.    Appearance: She is well-developed.  HENT:     Head: Normocephalic and atraumatic.  Eyes:     Conjunctiva/sclera: Conjunctivae normal.  Neck:     Comments: Mild TTP over R medial upper arm/axillary area, no skin changes, no bruising or deformity. Minimal R paraspinal neck pain. No stiffness. Cardiovascular:     Rate and Rhythm: Normal rate and regular rhythm.     Heart sounds: Normal heart sounds. No murmur heard.   No friction rub.  Pulmonary:     Effort: Pulmonary effort is normal. No respiratory distress.     Breath sounds: Normal breath sounds. No wheezing or rales.  Abdominal:     General: There is no distension.     Palpations: Abdomen is soft.     Tenderness: There is no abdominal tenderness.  Musculoskeletal:     Cervical back: Neck supple.  Skin:    General: Skin is warm.     Capillary Refill: Capillary refill takes less than 2 seconds.  Neurological:     Mental Status: She is alert and oriented to person, place, and time.     Motor: No abnormal muscle tone.     Comments: Strength 5/5 bl UE, normal grip strength. Normal sensation to light touch b/l UE.       ____________________________________________   LABS (all labs ordered are listed, but only abnormal results are displayed)  Labs Reviewed  COMPREHENSIVE METABOLIC PANEL - Abnormal; Notable for the following components:      Result Value   Glucose, Bld 104 (*)    Calcium 8.8 (*)    All other components within normal limits  CBC WITH DIFFERENTIAL/PLATELET    ____________________________________________  EKG: Normal sinus rhythm, VR 79. PR 124, QRS 80, QTc 419. No acute St elevations or depressions. No ischemia or  infarct. ________________________________________  RADIOLOGY All imaging, including plain films, CT scans, and ultrasounds, independently reviewed by me, and interpretations confirmed via formal radiology reads.  ED MD interpretation:   CX: Clear   Official radiology report(s): DG Chest 2 View  Result Date: 10/02/2021 CLINICAL DATA:  Chest pain and shortness of breath EXAM: CHEST - 2 VIEW COMPARISON:  None. FINDINGS: Heart size is normal. Mediastinal shadows are normal. The lungs are clear. No bronchial thickening. No infiltrate, mass, effusion or collapse. Pulmonary vascularity is normal. No bony abnormality. IMPRESSION:  Normal chest Electronically Signed   By: Nelson Chimes M.D.   On: 10/02/2021 14:30    ____________________________________________  PROCEDURES   Procedure(s) performed (including Critical Care):  Procedures  ____________________________________________  INITIAL IMPRESSION / MDM / [place] / ED COURSE  As part of my medical decision making, I reviewed the following data within the [place] notes reviewed and incorporated, Old chart reviewed, Notes from prior ED visits, and [place] Controlled Substance Database       *Kahmiya Mcmullin was evaluated in Emergency Department on 10/02/2021 for the symptoms described in the history of present illness. She was evaluated in the context of the global COVID-19 pandemic, which necessitated consideration that the patient might be at risk for infection with the SARS-CoV-2 virus that causes COVID-19. Institutional protocols and algorithms that pertain to the evaluation of patients at risk for COVID-19 are in a state of rapid change based on information released by regulatory bodies including the CDC and federal and state organizations. These policies and algorithms were followed during the patient's care in the ED.  Some ED evaluations and interventions may be delayed as a result of limited staffing  during the pandemic.*     Medical Decision Making: Pleasant 40 year old female here with right upper arm and shoulder pain.  Primary suspicion is likely musculoskeletal pain, possibly related to cervical radiculopathy or brachial plexopathy.  Patient holds her 18-year-old son with this arm, sleeps with above her head, and symptoms did seem to begin after sleeping and are positional in nature.  She has some symptoms that are consistent with a cervical radiculopathy, though she has no persistent numbness, weakness, or signs of significant cord compromise or injury.  Grip strength and sensation is intact.  Distal perfusion is fully intact.  No evidence of ischemia.  Otherwise, chest x-ray is clear.  Patient has no evidence to suggest referred pain from cardiac source.  EKG is nonischemic.  CMP unremarkable and patient has no right upper quadrant tenderness, is status post cholecystectomy, do not suspect occult pain from gallbladder or liver area.  Patient is PERC negative with no clinical signs of PE.  Pain is not consistent with dissection.  Will treat with NSAIDs, limited lifting with the arm, and good return precautions.  ____________________________________________  FINAL CLINICAL IMPRESSION(S) / ED DIAGNOSES  Final diagnoses:  Cervical radicular pain  Atypical chest pain  Right arm pain     MEDICATIONS GIVEN DURING THIS VISIT:  Medications - No data to display   ED Discharge Orders          Ordered    naproxen (NAPROSYN) 500 MG tablet  2 times daily with meals        10/02/21 1715    cyclobenzaprine (FLEXERIL) 10 MG tablet  3 times daily PRN        10/02/21 1715             Note:  This document was prepared using Dragon voice recognition software and may include unintentional dictation errors.   Duffy Bruce, MD 10/02/21 [phone]

## 2021-10-02 NOTE — ED Triage Notes (Signed)
Pt reports having some chest pain and sob and states that its causing her right arm feels sore like a pulled muscle with intermittent throbbing, started yesterday am

## 2021-12-26 DIAGNOSIS — I471 Supraventricular tachycardia: Secondary | ICD-10-CM | POA: Diagnosis not present

## 2021-12-26 DIAGNOSIS — Z0000 Encounter for general adult medical examination without abnormal findings: Secondary | ICD-10-CM | POA: Diagnosis not present

## 2021-12-26 DIAGNOSIS — E782 Mixed hyperlipidemia: Secondary | ICD-10-CM | POA: Diagnosis not present

## 2021-12-26 DIAGNOSIS — Z6829 Body mass index (BMI) 29.0-29.9, adult: Secondary | ICD-10-CM | POA: Diagnosis not present

## 2021-12-26 DIAGNOSIS — Z0001 Encounter for general adult medical examination with abnormal findings: Secondary | ICD-10-CM | POA: Diagnosis not present

## 2021-12-26 DIAGNOSIS — Z803 Family history of malignant neoplasm of breast: Secondary | ICD-10-CM | POA: Diagnosis not present

## 2021-12-26 DIAGNOSIS — Z1231 Encounter for screening mammogram for malignant neoplasm of breast: Secondary | ICD-10-CM | POA: Diagnosis not present

## 2021-12-26 DIAGNOSIS — R69 Illness, unspecified: Secondary | ICD-10-CM | POA: Diagnosis not present

## 2022-02-12 DIAGNOSIS — Z6830 Body mass index (BMI) 30.0-30.9, adult: Secondary | ICD-10-CM | POA: Diagnosis not present

## 2022-02-12 DIAGNOSIS — M2550 Pain in unspecified joint: Secondary | ICD-10-CM | POA: Diagnosis not present

## 2022-02-12 DIAGNOSIS — R5383 Other fatigue: Secondary | ICD-10-CM | POA: Diagnosis not present

## 2022-04-09 ENCOUNTER — Ambulatory Visit
Admission: EM | Admit: 2022-04-09 | Discharge: 2022-04-09 | Disposition: A | Discharge status: Home / Self Care | Payer: 59

## 2022-04-09 ENCOUNTER — Other Ambulatory Visit: Payer: Self-pay

## 2022-04-09 DIAGNOSIS — M545 Low back pain, unspecified: Secondary | ICD-10-CM | POA: Diagnosis not present

## 2022-04-09 MED ORDER — CYCLOBENZAPRINE HCL 10 MG PO TABS: 10.0000 mg | ORAL_TABLET | Freq: Every day | ORAL | 0 refills | Status: AC

## 2022-04-09 MED ORDER — PREDNISONE 20 MG PO TABS: 40.0000 mg | ORAL_TABLET | Freq: Every day | ORAL | 0 refills | Status: AC

## 2022-04-09 MED ORDER — KETOROLAC TROMETHAMINE 60 MG/2ML IM SOLN
30.0000 mg | Freq: Once | INTRAMUSCULAR | Status: AC
  Administered 2022-04-09: 30 mg via INTRAMUSCULAR

## 2022-04-09 NOTE — ED Provider Notes (Signed)
[facility]    CSN: [id number] Arrival date & time: 04/09/22  1325      History   Chief Complaint Chief Complaint  Patient presents with   Back Pain    HPI Courtney Stein is a 41 y.o. female.   Patient presents with midline lower back pain above the tailbone beginning 1 day ago.  Endorses that she was cleaning her pool doing a pushing pulling motion when she felt a spasming in her back, has had pain since.  Pain and numbness radiate down the legs but did not involve the feet.  Very painful to bear weight, feels as if her legs will give out .range of motion intact.  Has attempted use of Flexeril, lidocaine patches, Tylenol and ibuprofen which have been ineffective.  Denies urinary or bowel incontinence.    Past Medical History:  Diagnosis Date   ADHD (attention deficit hyperactivity disorder)     There are no problems to display for this patient.   History reviewed. No pertinent surgical history.  OB History   No obstetric history on file.      Home Medications    Prior to Admission medications   Medication Sig Start Date End Date Taking? Authorizing Provider  amphetamine-dextroamphetamine (ADDERALL) 10 MG tablet Take 1 tablet by mouth daily. 03/14/22 06/08/22 Yes [provider]  amphetamine-dextroamphetamine (ADDERALL) 20 MG tablet Take 25 mg by mouth daily.    [provider]  cyclobenzaprine (FLEXERIL) 10 MG tablet Take 1 tablet (10 mg total) by mouth 3 (three) times daily as needed for muscle spasms. 10/02/21   Shaune Pollack, MD  ondansetron (ZOFRAN ODT) 8 MG disintegrating tablet Take 1 tablet (8 mg total) by mouth 2 (two) times daily. 11/20/16   Lutricia Feil, PA-C  pantoprazole (PROTONIX) 40 MG tablet Take 1 tablet (40 mg total) by mouth daily. 11/20/16   Lutricia Feil, PA-C  ranitidine (ZANTAC) 150 MG tablet Take 150 mg by mouth 2 (two) times daily.    [provider]    Family History Family History  Problem  Relation Age of Onset   Cholecystitis Mother    Hypertension Father     Social History Social History   Tobacco Use   Smoking status: Never   Smokeless tobacco: Never  Vaping Use   Vaping Use: Never used  Substance Use Topics   Alcohol use: Yes    Comment: social   Drug use: No     Allergies   Hydrocodone, Codeine, and Reglan [metoclopramide]   Review of Systems Review of Systems  Constitutional: Negative.   HENT: Negative.    Respiratory: Negative.    Cardiovascular: Negative.   Musculoskeletal:  Positive for back pain. Negative for arthralgias, gait problem, joint swelling, myalgias, neck pain and neck stiffness.  Skin: Negative.     Physical Exam Triage Vital Signs ED Triage Vitals  Enc Vitals Group     BP 04/09/22 1403 110/81     Pulse Rate 04/09/22 1403 84     Resp 04/09/22 1403 16     Temp 04/09/22 1403 98.3 F (36.8 C)     Temp Source 04/09/22 1403 Oral     SpO2 04/09/22 1403 98 %     Weight 04/09/22 1359 185 lb (83.9 kg)     Height 04/09/22 1359 5\' 6"  (1.676 m)     Head Circumference --      Peak Flow --      Pain Score 04/09/22 1358 10  Pain Loc --      Pain Edu? --      Excl. in GC? --    No data found.  Updated Vital Signs BP 110/81 (BP Location: Right Arm)   Pulse 84   Temp 98.3 F (36.8 C) (Oral)   Resp 16   Ht 5\' 6"  (1.676 m)   Wt 185 lb (83.9 kg)   LMP 03/20/2022   SpO2 98%   BMI 29.86 kg/m   Visual Acuity Right Eye Distance:   Left Eye Distance:   Bilateral Distance:    Right Eye Near:   Left Eye Near:    Bilateral Near:     Physical Exam Constitutional:      Appearance: Normal appearance.  HENT:     Head: Normocephalic.  Eyes:     Extraocular Movements: Extraocular movements intact.  Pulmonary:     Effort: Pulmonary effort is normal.  Musculoskeletal:     Comments: Tenderness along the bilateral lumbar region without ecchymosis, swelling or deformity noted, range of motion intact, able to bear weight but  elicits pain  Skin:    General: Skin is warm and dry.  Neurological:     Mental Status: She is alert and oriented to person, place, and time. Mental status is at baseline.  Psychiatric:        Mood and Affect: Mood normal.        Behavior: Behavior normal.     UC Treatments / Results  Labs (all labs ordered are listed, but only abnormal results are displayed) Labs Reviewed - No data to display  EKG   Radiology No results found.  Procedures Procedures (including critical care time)  Medications Ordered in UC Medications - No data to display  Initial Impression / Assessment and Plan / UC Course  I have reviewed the triage vital signs and the nursing notes.  Pertinent labs & imaging results that were available during my care of the patient were reviewed by me and considered in my medical decision making (see chart for details).  Acute bilateral low back pain without sciatica  Etiology of symptoms is most likely muscular, discussed with patient, Toradol injection given in office, prescribed prednisone 40 mg burst and Flexeril to be used outpatient, recommended RICE, heat, pillows for support, daily stretching and activity as tolerated, given walker referral to orthopedics if pain continues to persist,  Final Clinical Impressions(s) / UC Diagnoses   Final diagnoses:  None   Discharge Instructions   None    ED Prescriptions   None    PDMP not reviewed this encounter.   05/20/2022, NP 04/09/22 [phone]

## 2022-04-09 NOTE — ED Triage Notes (Signed)
Pt states she was cleaning her pool yesterday when she felt a spasm in her low back and now "I can hardly walk."

## 2022-04-09 NOTE — Discharge Instructions (Signed)
Your pain is most likely caused by irritation to the muscles or ligaments.   Starting tomorrow take prednisone every morning with food for 5 days  You may use Flexeril at bedtime as needed for additional comfort, be mindful this medication may make you drowsy  You may use heating pad in 15 minute intervals as needed for additional comfort, or you may find comfort in using ice in 10-15 minutes over affected area  Begin stretching affected area daily for 10 minutes as tolerated to further loosen muscles   When lying down place pillow underneath and between knees for support  Can try sleeping without pillow on firm mattress   Practice good posture: head back, shoulders back, chest forward, pelvis back and weight distributed evenly on both legs  If pain persist after recommended treatment or reoccurs if may be beneficial to follow up with orthopedic specialist for evaluation, this doctor specializes in the bones and can manage your symptoms long-term with options such as but not limited to imaging, medications or physical therapy

## 2022-04-10 DIAGNOSIS — M5416 Radiculopathy, lumbar region: Secondary | ICD-10-CM | POA: Diagnosis not present

## 2022-05-07 DIAGNOSIS — R69 Illness, unspecified: Secondary | ICD-10-CM | POA: Diagnosis not present

## 2022-05-07 DIAGNOSIS — Z6829 Body mass index (BMI) 29.0-29.9, adult: Secondary | ICD-10-CM | POA: Diagnosis not present

## 2022-05-07 DIAGNOSIS — M5416 Radiculopathy, lumbar region: Secondary | ICD-10-CM | POA: Diagnosis not present

## 2022-05-29 DIAGNOSIS — H5213 Myopia, bilateral: Secondary | ICD-10-CM | POA: Diagnosis not present

## 2022-06-16 IMAGING — CR DG CHEST 2V
1 series · 2 of 2 positions shown · non-contrast
Comparison: None.

CLINICAL DATA: Chest pain and shortness of breath

EXAM:
CHEST - 2 VIEW

[Series 1: dg chest 2 view · 0.14mm/px · 2 of 2 slices shown]
[im 1/2]
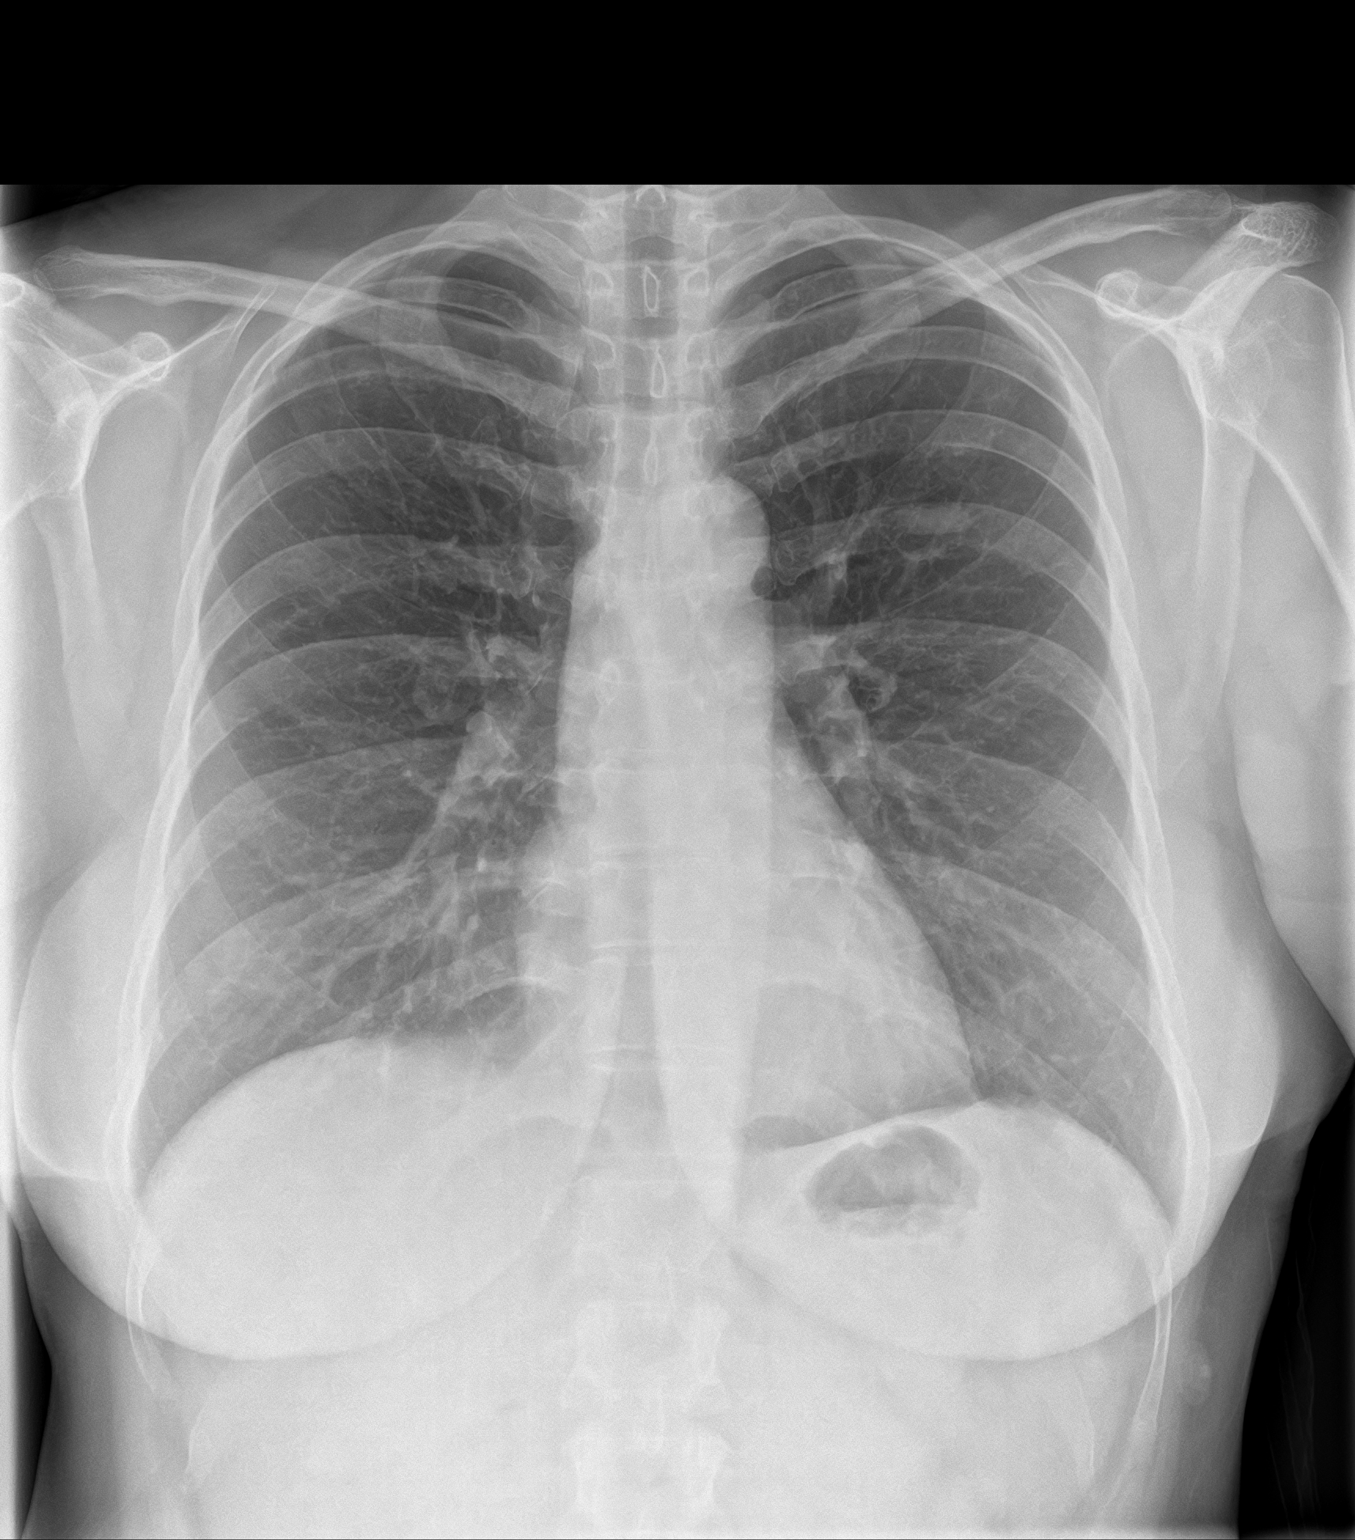
[im 2/2]
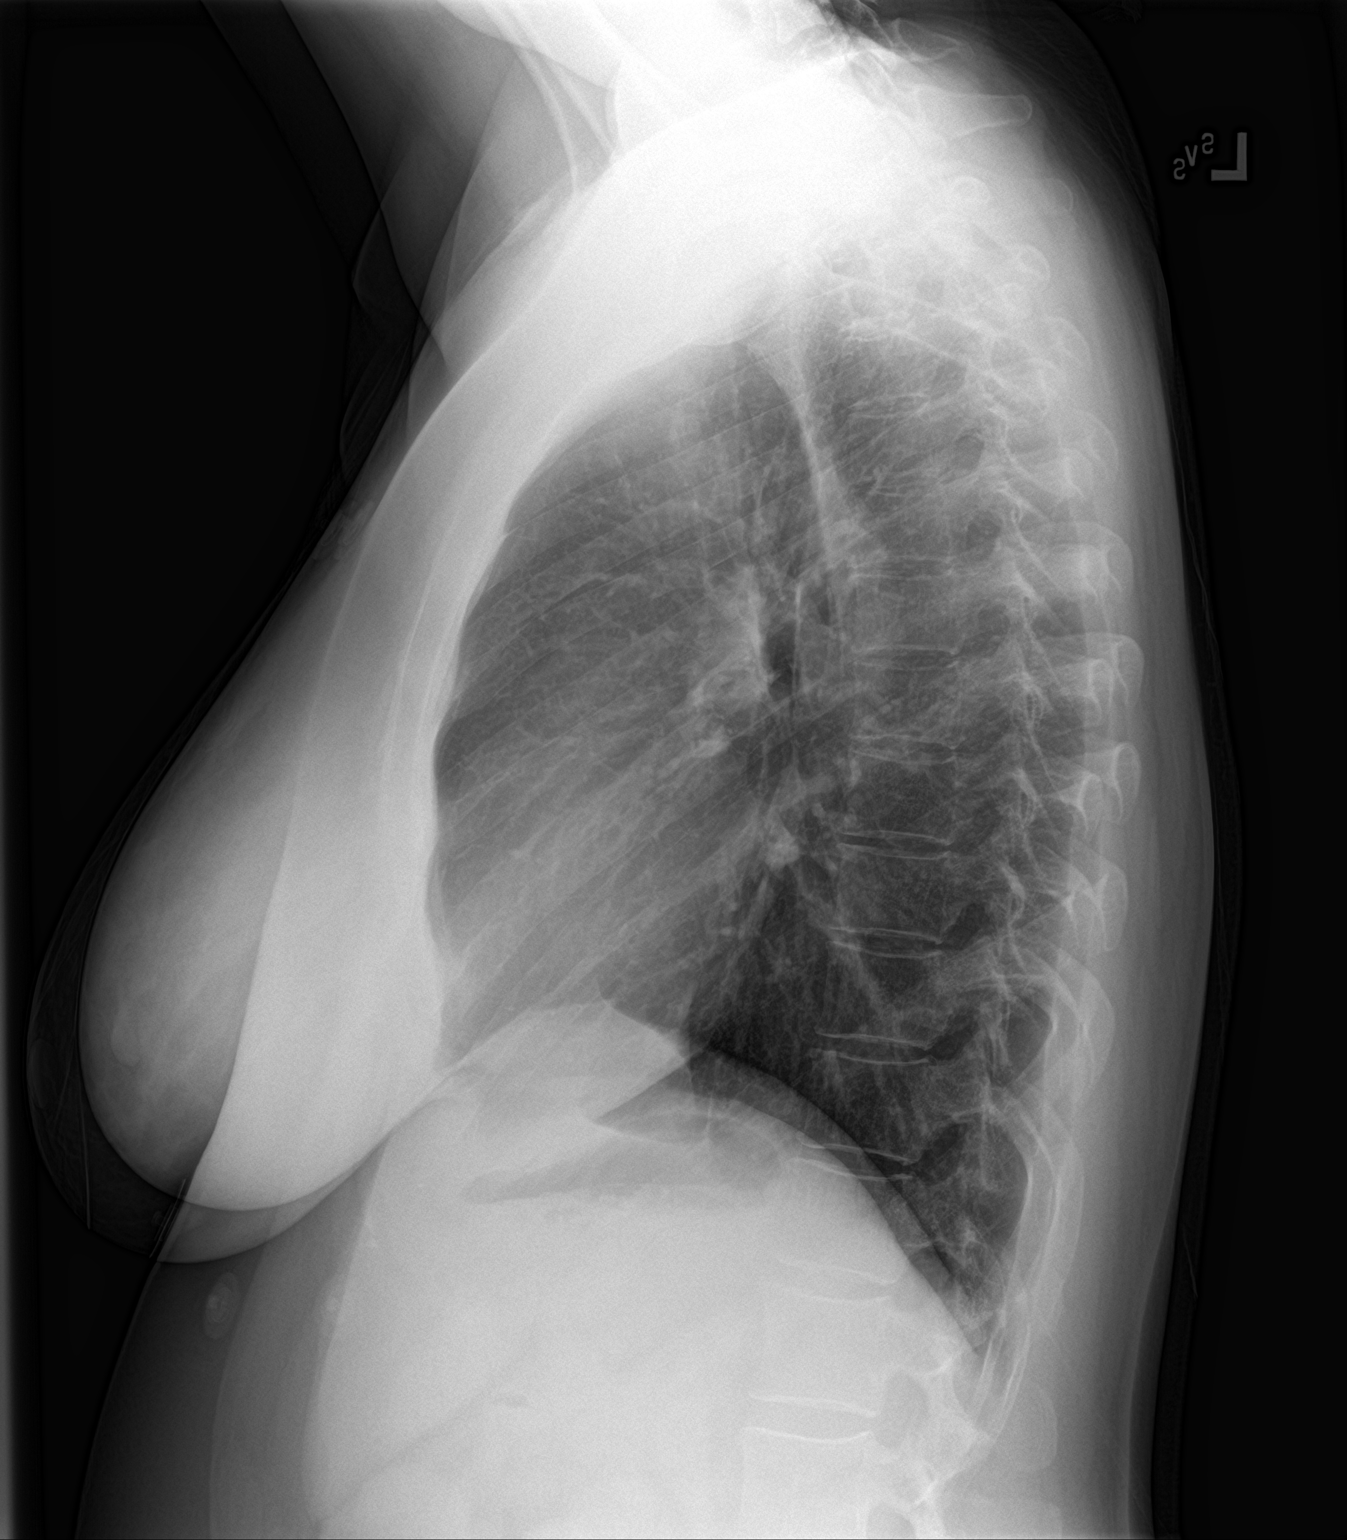

[2 of 2 positions shown; findings below may reference images not displayed]

FINDINGS: Heart size is normal. Mediastinal shadows are normal. The lungs are
clear. No bronchial thickening. No infiltrate, mass, effusion or
collapse. Pulmonary vascularity is normal. No bony abnormality.
IMPRESSION: Normal chest

## 2022-08-08 ENCOUNTER — Ambulatory Visit: Payer: 59

## 2022-12-12 DIAGNOSIS — Z6832 Body mass index (BMI) 32.0-32.9, adult: Secondary | ICD-10-CM | POA: Diagnosis not present

## 2022-12-12 DIAGNOSIS — R030 Elevated blood-pressure reading, without diagnosis of hypertension: Secondary | ICD-10-CM | POA: Diagnosis not present

## 2022-12-12 DIAGNOSIS — R69 Illness, unspecified: Secondary | ICD-10-CM | POA: Diagnosis not present

## 2022-12-12 DIAGNOSIS — E669 Obesity, unspecified: Secondary | ICD-10-CM | POA: Diagnosis not present

## 2023-02-18 DIAGNOSIS — R1310 Dysphagia, unspecified: Secondary | ICD-10-CM | POA: Diagnosis not present

## 2023-02-18 DIAGNOSIS — Z6830 Body mass index (BMI) 30.0-30.9, adult: Secondary | ICD-10-CM | POA: Diagnosis not present

## 2023-02-18 DIAGNOSIS — F988 Other specified behavioral and emotional disorders with onset usually occurring in childhood and adolescence: Secondary | ICD-10-CM | POA: Diagnosis not present

## 2023-02-18 DIAGNOSIS — K219 Gastro-esophageal reflux disease without esophagitis: Secondary | ICD-10-CM | POA: Diagnosis not present

## 2023-02-18 DIAGNOSIS — E669 Obesity, unspecified: Secondary | ICD-10-CM | POA: Diagnosis not present

## 2023-05-24 DIAGNOSIS — F988 Other specified behavioral and emotional disorders with onset usually occurring in childhood and adolescence: Secondary | ICD-10-CM | POA: Diagnosis not present

## 2023-05-24 DIAGNOSIS — R1310 Dysphagia, unspecified: Secondary | ICD-10-CM | POA: Diagnosis not present

## 2023-05-24 DIAGNOSIS — E663 Overweight: Secondary | ICD-10-CM | POA: Diagnosis not present

## 2023-05-24 DIAGNOSIS — Z6828 Body mass index (BMI) 28.0-28.9, adult: Secondary | ICD-10-CM | POA: Diagnosis not present

## 2023-05-24 DIAGNOSIS — M5416 Radiculopathy, lumbar region: Secondary | ICD-10-CM | POA: Diagnosis not present

## 2023-05-24 DIAGNOSIS — K219 Gastro-esophageal reflux disease without esophagitis: Secondary | ICD-10-CM | POA: Diagnosis not present

## 2023-06-25 DIAGNOSIS — M5451 Vertebrogenic low back pain: Secondary | ICD-10-CM | POA: Diagnosis not present

## 2023-06-25 DIAGNOSIS — M5416 Radiculopathy, lumbar region: Secondary | ICD-10-CM | POA: Diagnosis not present

## 2023-09-24 DIAGNOSIS — F419 Anxiety disorder, unspecified: Secondary | ICD-10-CM | POA: Diagnosis not present

## 2023-09-24 DIAGNOSIS — Z6826 Body mass index (BMI) 26.0-26.9, adult: Secondary | ICD-10-CM | POA: Diagnosis not present

## 2023-09-24 DIAGNOSIS — F988 Other specified behavioral and emotional disorders with onset usually occurring in childhood and adolescence: Secondary | ICD-10-CM | POA: Diagnosis not present

## 2023-09-24 DIAGNOSIS — R1310 Dysphagia, unspecified: Secondary | ICD-10-CM | POA: Diagnosis not present

## 2023-09-24 DIAGNOSIS — F32A Depression, unspecified: Secondary | ICD-10-CM | POA: Diagnosis not present

## 2023-09-24 DIAGNOSIS — E663 Overweight: Secondary | ICD-10-CM | POA: Diagnosis not present

## 2024-03-31 DIAGNOSIS — Z0000 Encounter for general adult medical examination without abnormal findings: Secondary | ICD-10-CM | POA: Diagnosis not present

## 2024-03-31 DIAGNOSIS — F988 Other specified behavioral and emotional disorders with onset usually occurring in childhood and adolescence: Secondary | ICD-10-CM | POA: Diagnosis not present

## 2024-03-31 DIAGNOSIS — Z131 Encounter for screening for diabetes mellitus: Secondary | ICD-10-CM | POA: Diagnosis not present

## 2024-03-31 DIAGNOSIS — Z124 Encounter for screening for malignant neoplasm of cervix: Secondary | ICD-10-CM | POA: Diagnosis not present

## 2024-03-31 DIAGNOSIS — L989 Disorder of the skin and subcutaneous tissue, unspecified: Secondary | ICD-10-CM | POA: Diagnosis not present

## 2024-03-31 DIAGNOSIS — Z1231 Encounter for screening mammogram for malignant neoplasm of breast: Secondary | ICD-10-CM | POA: Diagnosis not present

## 2024-03-31 DIAGNOSIS — F32A Depression, unspecified: Secondary | ICD-10-CM | POA: Diagnosis not present

## 2024-03-31 DIAGNOSIS — F419 Anxiety disorder, unspecified: Secondary | ICD-10-CM | POA: Diagnosis not present

## 2024-03-31 DIAGNOSIS — Z13220 Encounter for screening for lipoid disorders: Secondary | ICD-10-CM | POA: Diagnosis not present

## 2024-10-02 DIAGNOSIS — Z23 Encounter for immunization: Secondary | ICD-10-CM | POA: Diagnosis not present

## 2024-10-02 DIAGNOSIS — G8929 Other chronic pain: Secondary | ICD-10-CM | POA: Diagnosis not present

## 2024-10-02 DIAGNOSIS — J9801 Acute bronchospasm: Secondary | ICD-10-CM | POA: Diagnosis not present

## 2024-10-02 DIAGNOSIS — R0602 Shortness of breath: Secondary | ICD-10-CM | POA: Diagnosis not present

## 2024-10-02 DIAGNOSIS — Z77120 Contact with and (suspected) exposure to mold (toxic): Secondary | ICD-10-CM | POA: Diagnosis not present

## 2024-10-02 DIAGNOSIS — F419 Anxiety disorder, unspecified: Secondary | ICD-10-CM | POA: Diagnosis not present

## 2024-10-02 DIAGNOSIS — M25562 Pain in left knee: Secondary | ICD-10-CM | POA: Diagnosis not present

## 2024-10-02 DIAGNOSIS — R1310 Dysphagia, unspecified: Secondary | ICD-10-CM | POA: Diagnosis not present

## 2024-10-02 DIAGNOSIS — F32A Depression, unspecified: Secondary | ICD-10-CM | POA: Diagnosis not present

## 2024-10-02 DIAGNOSIS — M545 Low back pain, unspecified: Secondary | ICD-10-CM | POA: Diagnosis not present

## 2024-10-02 DIAGNOSIS — K219 Gastro-esophageal reflux disease without esophagitis: Secondary | ICD-10-CM | POA: Diagnosis not present

## 2024-10-02 DIAGNOSIS — F988 Other specified behavioral and emotional disorders with onset usually occurring in childhood and adolescence: Secondary | ICD-10-CM | POA: Diagnosis not present

## 2024-10-02 DIAGNOSIS — N951 Menopausal and female climacteric states: Secondary | ICD-10-CM | POA: Diagnosis not present

## 2024-10-26 ENCOUNTER — Telehealth: Admitting: Family Medicine

## 2024-10-26 DIAGNOSIS — H9209 Otalgia, unspecified ear: Secondary | ICD-10-CM

## 2024-10-26 NOTE — Progress Notes (Signed)
  Because Courtney Stein, I feel your condition warrants further evaluation and I recommend that you be seen in a face-to-face visit. You could have ruptured your eardrum. This will need to be examined in person. Thank you!   NOTE: There will be NO CHARGE for this E-Visit   If you are having a true medical emergency, please call 911.     For an urgent face to face visit, [name] has multiple urgent care centers for your convenience.  Click the link below for the full list of locations and hours, walk-in wait times, appointment scheduling options and driving directions:  Urgent Care - [place], [place], [place], [place], [place], [place]       Your MyChart E-visit questionnaire answers were reviewed by a board certified advanced clinical practitioner to complete your personal care plan based on your specific symptoms.    Thank you for using e-Visits.
